# Patient Record
Sex: Female | Born: 1965 | Race: White | Hispanic: No | Marital: Single
Health system: Southern US, Community
[De-identification: ages and names within clinical notes are randomized; demographics above are authoritative.]

## PROBLEM LIST (undated history)

## (undated) DIAGNOSIS — I10 Essential (primary) hypertension: Secondary | ICD-10-CM

---

## 2003-12-15 ENCOUNTER — Emergency Department (HOSPITAL_COMMUNITY): Admission: EM | Admit: 2003-12-15 | Discharge: 2003-12-15 | Payer: Self-pay | Admitting: Emergency Medicine

## 2003-12-15 IMAGING — CR DG ANKLE COMPLETE 3+V*L*
2 series · 2 of 2 positions shown · non-contrast
Comparison: none

CLINICAL DATA: Injury.  
 LEFT ANKLE (THREE VIEWS)

[view not recorded (1 of 2)]
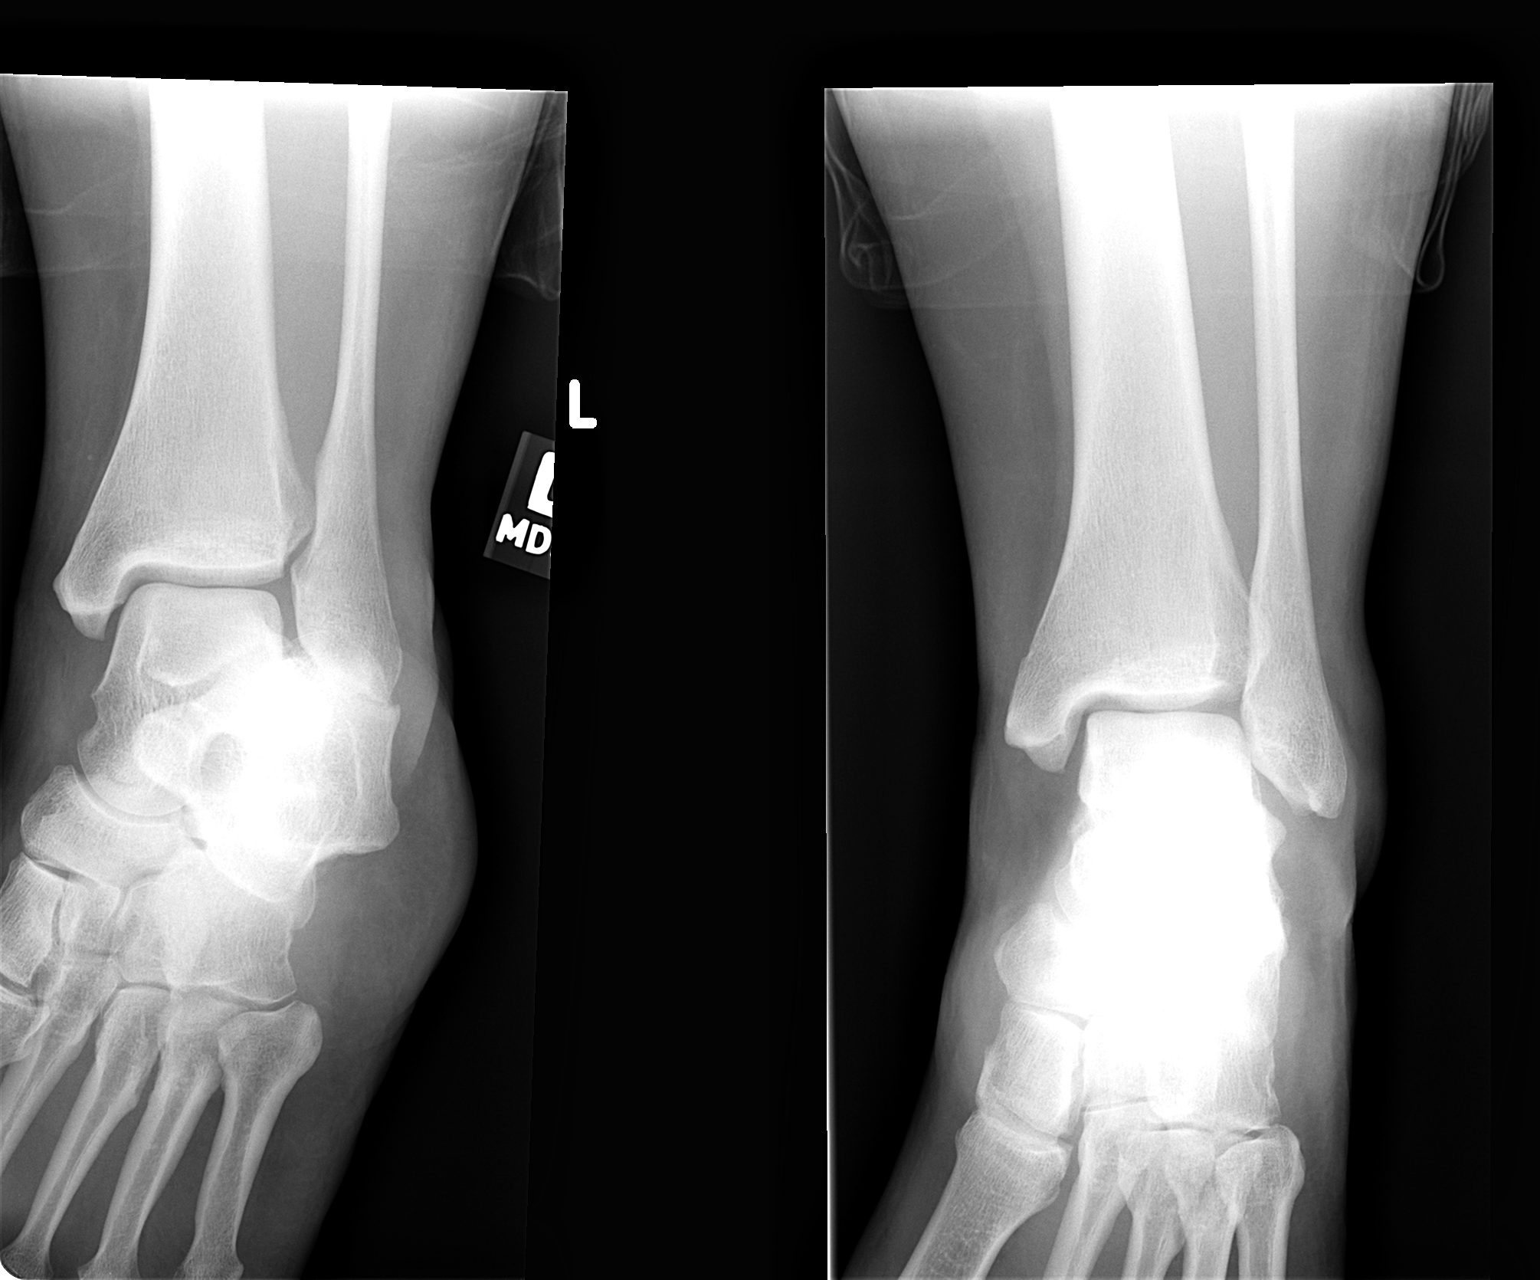

[view not recorded (2 of 2)]
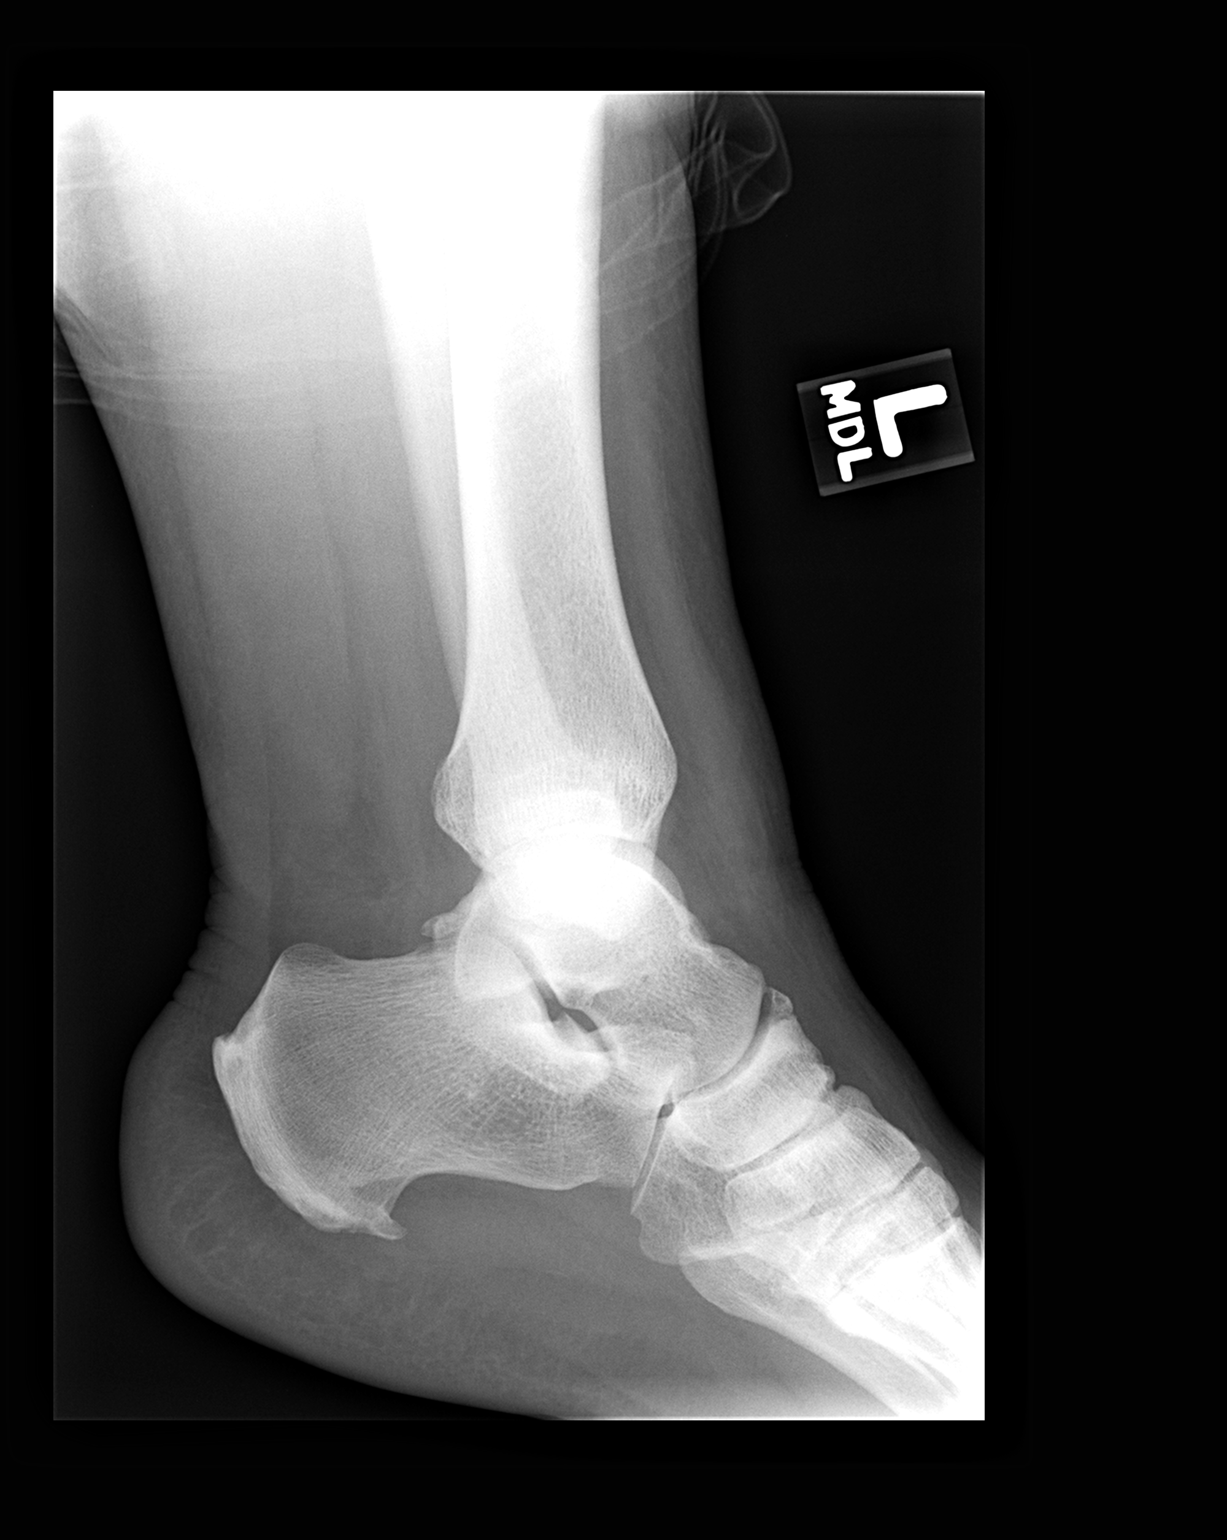

[2 of 2 positions shown; findings below may reference images not displayed]

FINDINGS: No evidence of fracture or dislocation.  If symptoms persists MR BENEK be considered for further delineation.  
 IMPRESSION
 No evidence of fracture.

## 2021-10-16 ENCOUNTER — Emergency Department (HOSPITAL_BASED_OUTPATIENT_CLINIC_OR_DEPARTMENT_OTHER): Payer: BC Managed Care – PPO

## 2021-10-16 ENCOUNTER — Emergency Department (HOSPITAL_BASED_OUTPATIENT_CLINIC_OR_DEPARTMENT_OTHER)
Admission: EM | Admit: 2021-10-16 | Discharge: 2021-10-16 | Disposition: A | Discharge status: Home / Self Care | Payer: BC Managed Care – PPO | Attending: Emergency Medicine | Admitting: Emergency Medicine

## 2021-10-16 ENCOUNTER — Emergency Department (HOSPITAL_COMMUNITY): Payer: BC Managed Care – PPO

## 2021-10-16 ENCOUNTER — Encounter (HOSPITAL_BASED_OUTPATIENT_CLINIC_OR_DEPARTMENT_OTHER): Payer: Self-pay

## 2021-10-16 ENCOUNTER — Other Ambulatory Visit: Payer: Self-pay

## 2021-10-16 DIAGNOSIS — R1110 Vomiting, unspecified: Secondary | ICD-10-CM | POA: Diagnosis not present

## 2021-10-16 DIAGNOSIS — R42 Dizziness and giddiness: Secondary | ICD-10-CM | POA: Diagnosis present

## 2021-10-16 DIAGNOSIS — R197 Diarrhea, unspecified: Secondary | ICD-10-CM | POA: Diagnosis not present

## 2021-10-16 DIAGNOSIS — R5383 Other fatigue: Secondary | ICD-10-CM | POA: Diagnosis not present

## 2021-10-16 DIAGNOSIS — R531 Weakness: Secondary | ICD-10-CM | POA: Diagnosis not present

## 2021-10-16 DIAGNOSIS — I10 Essential (primary) hypertension: Secondary | ICD-10-CM | POA: Insufficient documentation

## 2021-10-16 HISTORY — DX: Essential (primary) hypertension: I10

## 2021-10-16 LAB — COMPREHENSIVE METABOLIC PANEL
ALT: 13 U/L (ref 0–44)
AST: 14 U/L — ABNORMAL LOW (ref 15–41)
Albumin: 3.4 g/dL — ABNORMAL LOW (ref 3.5–5.0)
Alkaline Phosphatase: 106 U/L (ref 38–126)
Anion gap: 7 (ref 5–15)
BUN: 14 mg/dL (ref 6–20)
CO2: 26 mmol/L (ref 22–32)
Calcium: 8.7 mg/dL — ABNORMAL LOW (ref 8.9–10.3)
Chloride: 105 mmol/L (ref 98–111)
Creatinine, Ser: 0.83 mg/dL (ref 0.44–1.00)
GFR, Estimated: >60 mL/min (ref >60)
Glucose, Bld: 115 mg/dL — ABNORMAL HIGH (ref 70–99)
Potassium: 3.8 mmol/L (ref 3.5–5.1)
Sodium: 138 mmol/L (ref 135–145)
Total Bilirubin: 1 mg/dL (ref 0.3–1.2)
Total Protein: 6.9 g/dL (ref 6.5–8.1)

## 2021-10-16 LAB — CBC WITH DIFFERENTIAL/PLATELET
Abs Immature Granulocytes: 0.01 10*3/uL (ref 0.00–0.07)
Basophils Absolute: 0.1 10*3/uL (ref 0.0–0.1)
Basophils Relative: 1 %
Eosinophils Absolute: 0.2 10*3/uL (ref 0.0–0.5)
Eosinophils Relative: 2 %
HCT: 36.3 % (ref 36.0–46.0)
Hemoglobin: 12.1 g/dL (ref 12.0–15.0)
Immature Granulocytes: 0 %
Lymphocytes Relative: 16 %
Lymphs Abs: 1.3 10*3/uL (ref 0.7–4.0)
MCH: 30.1 pg (ref 26.0–34.0)
MCHC: 33.3 g/dL (ref 30.0–36.0)
MCV: 90.3 fL (ref 80.0–100.0)
Monocytes Absolute: 0.5 10*3/uL (ref 0.1–1.0)
Monocytes Relative: 5 %
Neutro Abs: 6.4 10*3/uL (ref 1.7–7.7)
Neutrophils Relative %: 76 %
Platelets: 259 10*3/uL (ref 150–400)
RBC: 4.02 MIL/uL (ref 3.87–5.11)
RDW: 14.2 % (ref 11.5–15.5)
WBC: 8.5 10*3/uL (ref 4.0–10.5)
nRBC: 0 % (ref 0.0–0.2)

## 2021-10-16 LAB — LIPASE, BLOOD: Lipase: 33 U/L (ref 11–51)

## 2021-10-16 IMAGING — MR MR CERVICAL SPINE W/O CM
5 series · 36 of 48 positions shown · non-contrast
Comparison: None.

CLINICAL DATA: Dizziness and fatigue

EXAM:
MRI HEAD WITHOUT CONTRAST
MRI CERVICAL SPINE WITHOUT CONTRAST
TECHNIQUE: Multiplanar, multiecho pulse sequences of the brain and surrounding
structures, and cervical spine, to include the craniocervical
junction and cervicothoracic junction, were obtained without
intravenous contrast.

[Series 5: T2 · sagittal · 3.0mm · 0.69mm/px · 6 of 15 slices shown (1 of 2)]
[im 1/15]
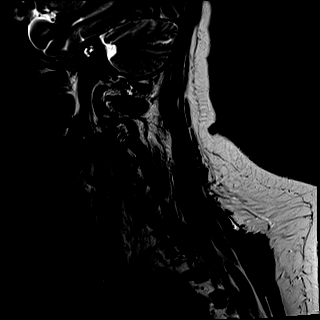
[im 3/15]
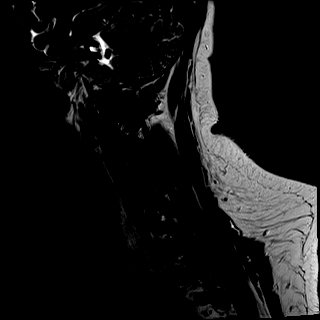
[im 6/15]
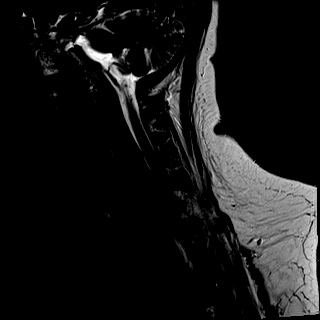
[im 9/15]
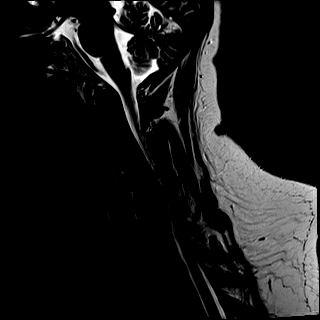
[im 12/15]
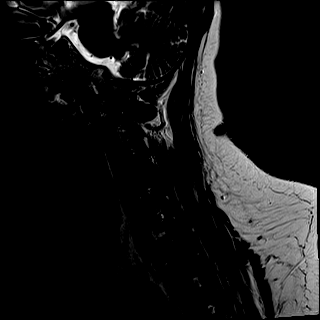
[im 15/15]
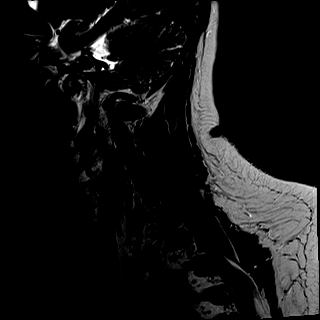

[Series 6: T1 · sagittal · 3.0mm · 0.69mm/px · 6 of 15 slices shown]
[im 1/15]
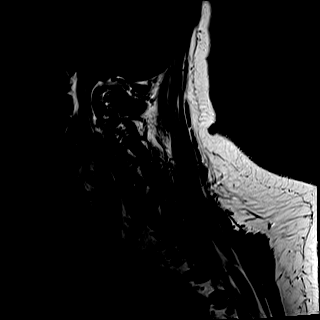
[im 3/15]
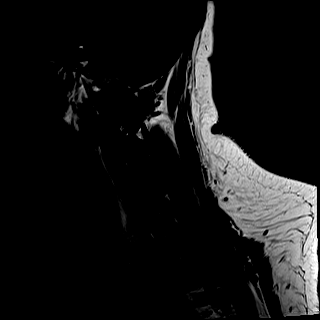
[im 6/15]
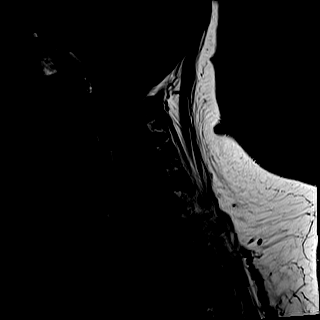
[im 9/15]
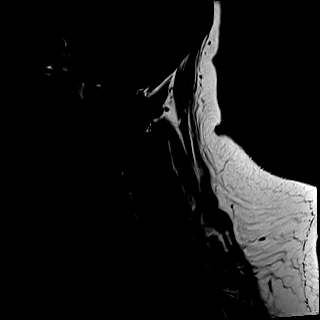
[im 12/15]
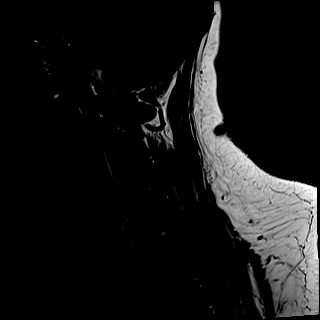
[im 15/15]
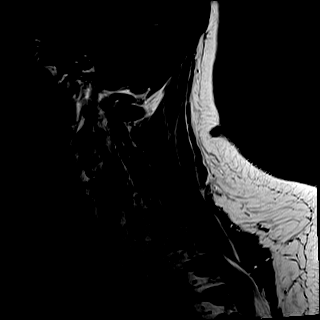

[Series 7: STIR · sagittal · 3.0mm · 0.86mm/px · 6 of 15 slices shown]
[im 1/15]
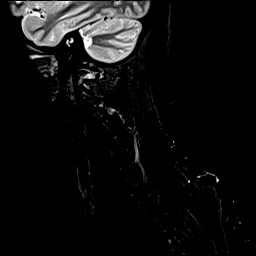
[im 3/15]
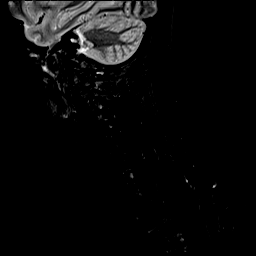
[im 6/15]
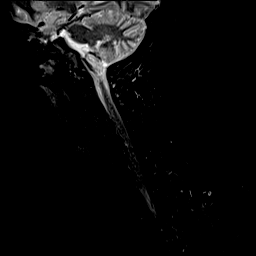
[im 9/15]
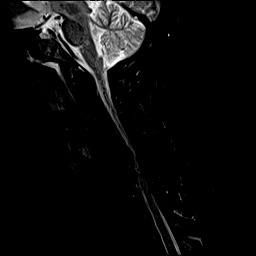
[im 12/15]
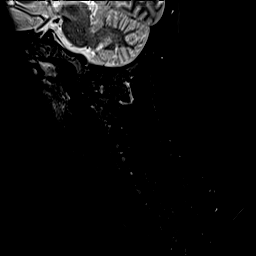
[im 15/15]
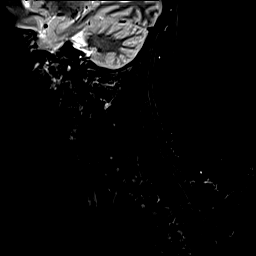

[Series 8: T2 · axial · 3.0mm · 0.66mm/px · z∈[-251,-131]mm · 10 of 40 slices shown (2 of 2)]
[im 1/40]
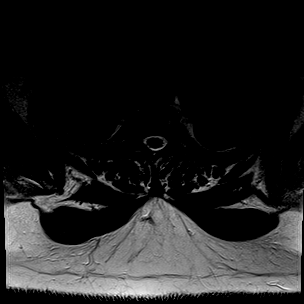
[im 3/40]
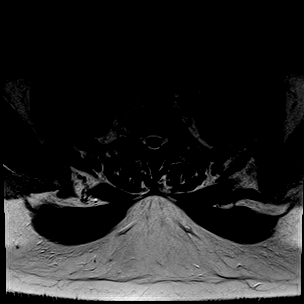
[im 6/40]
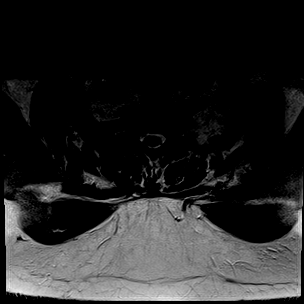
[im 12/40]
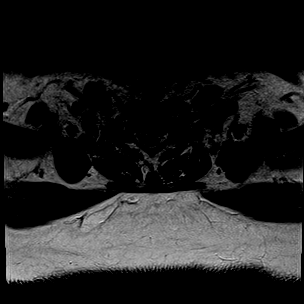
[im 17/40]
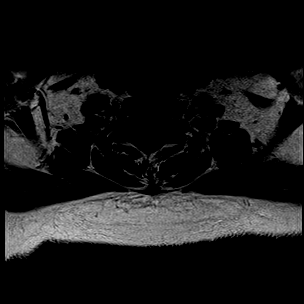
[im 20/40]
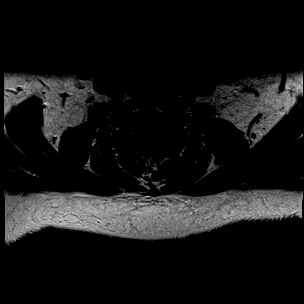
[im 23/40]
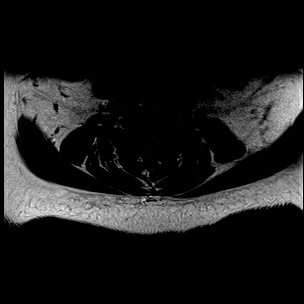
[im 28/40]
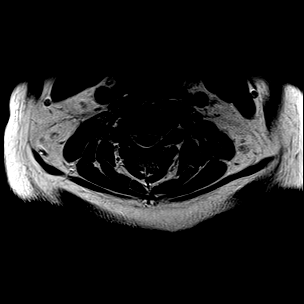
[im 34/40]
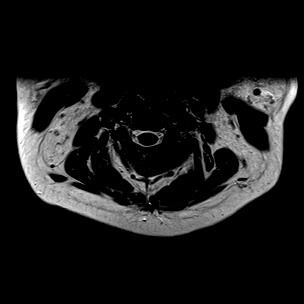
[im 40/40]
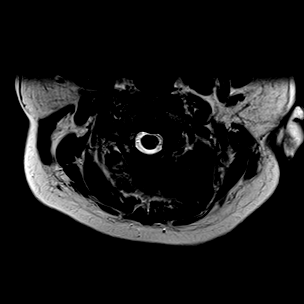

[Series 9: GRE · axial · 3.0mm · 0.39mm/px · z∈[-251,-131]mm · 8 of 40 slices shown]
[im 1/40]
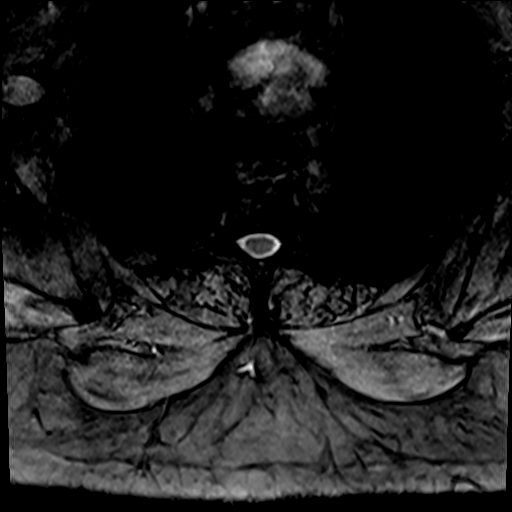
[im 6/40]
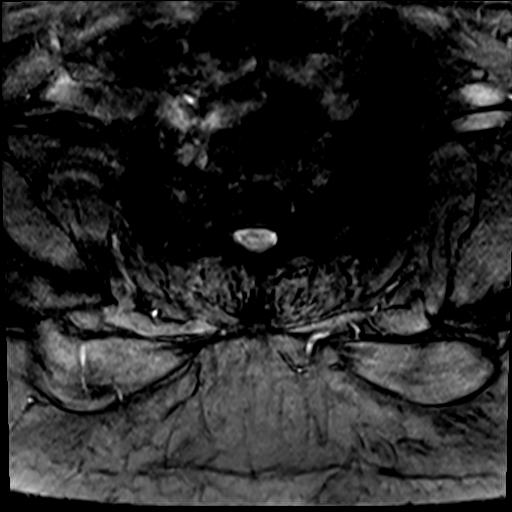
[im 12/40]
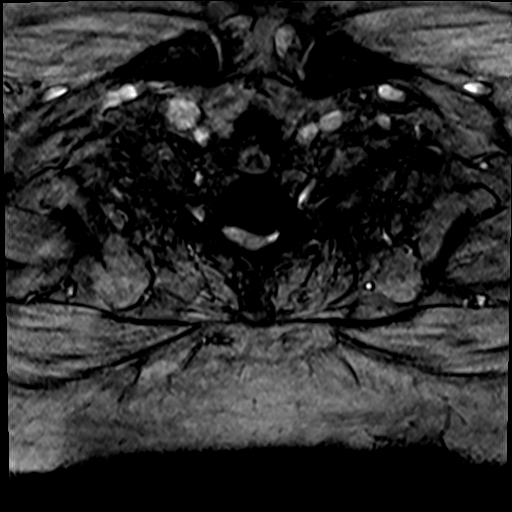
[im 17/40]
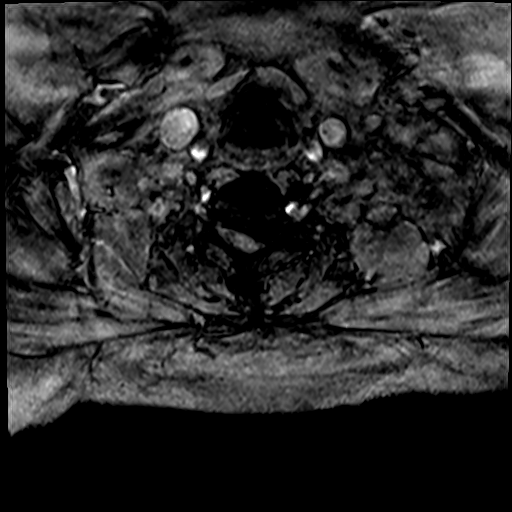
[im 23/40]
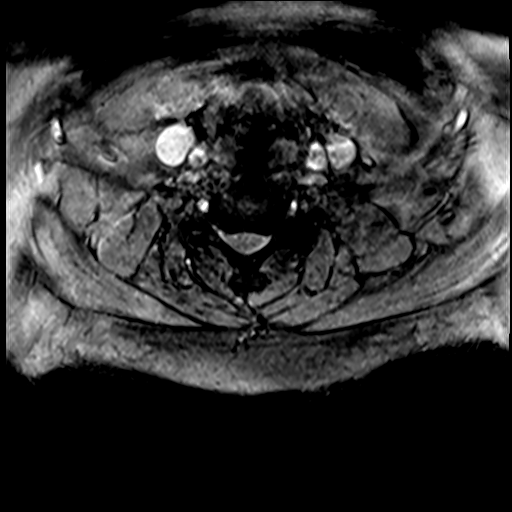
[im 28/40]
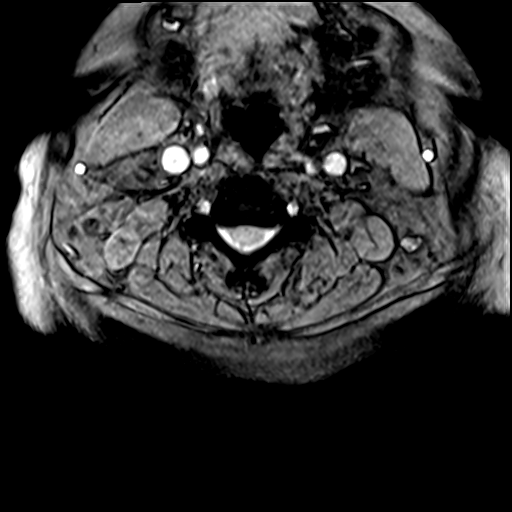
[im 34/40]
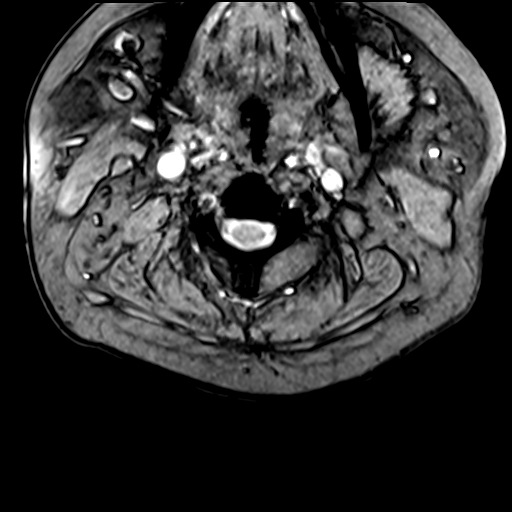
[im 40/40]
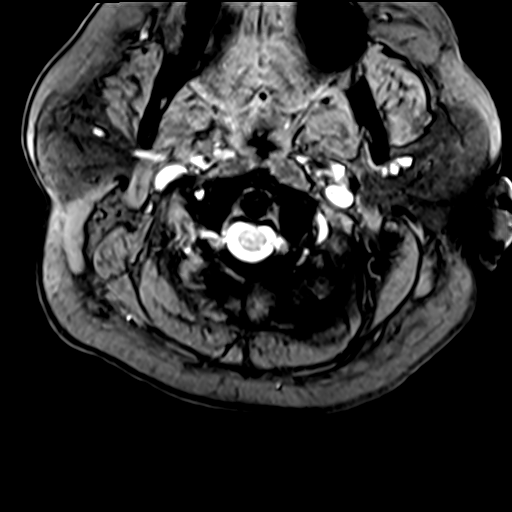

[36 of 48 positions shown; findings below may reference images not displayed]

FINDINGS: MRI HEAD FINDINGS

Brain: No acute infarct, mass effect or extra-axial collection. No
acute or chronic hemorrhage. Normal white matter signal, parenchymal
volume and CSF spaces. The midline structures are normal.

Vascular: Major flow voids are preserved.

Skull and upper cervical spine: Normal calvarium and skull base.
Visualized upper cervical spine and soft tissues are normal.

Sinuses/Orbits:No paranasal sinus fluid levels or advanced mucosal
thickening. No mastoid or middle ear effusion. Normal orbits.

MRI CERVICAL SPINE FINDINGS

Alignment: Physiologic.

Vertebrae: No fracture, evidence of discitis, or bone lesion.

Cord: Normal signal and morphology.

Posterior Fossa, vertebral arteries, paraspinal tissues: Negative.

Disc levels:

C1-2: Unremarkable.

C2-3: Normal disc space and facet joints. There is no spinal canal
stenosis. No neural foraminal stenosis.

C3-4: Bilateral uncovertebral spurring. There is no spinal canal
stenosis. Mild right and moderate left neural foraminal stenosis.

C4-5: Disc bulge and bilateral uncovertebral hypertrophy. There is
no spinal canal stenosis. Moderate right and severe left neural
foraminal stenosis.

C5-6: Large left subarticular disc extrusion with inferior
migration. Severe spinal canal stenosis. Severe left neural
foraminal stenosis.

C6-7: Left asymmetric disc bulge with uncovertebral hypertrophy.
Mild spinal canal stenosis. Severe left neural foraminal stenosis.

C7-T1: Normal disc space and facet joints. There is no spinal canal
stenosis. No neural foraminal stenosis.
IMPRESSION: 1. Normal MRI of the brain.
2. Large left subarticular disc extrusion with inferior migration at
C5-6 with severe spinal canal stenosis and severe left neural
foraminal stenosis.
3. Moderate right and severe left neural foraminal stenosis at C4-5.
4. Mild spinal canal stenosis and severe left neural foraminal
stenosis at C6-7.

## 2021-10-16 IMAGING — CT CT HEAD W/O CM
3 series · 15 of 47 positions shown, 18 images · non-contrast
Comparison: None.

CLINICAL DATA: Syncope/presyncope, cerebrovascular cause suspected.
Dizziness, nausea, and hypertension.



[Series 2: head wo · axial · 0.42mm/px · z∈[-195,-65]mm · 9 of 32 slices shown, 12 images]
[im 3/32  brain]
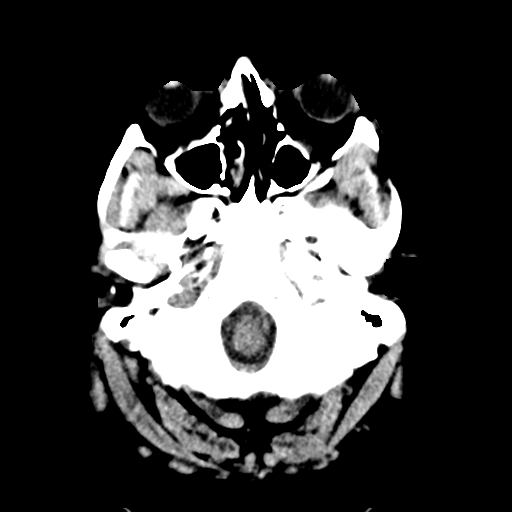
[im 3/32  bone]
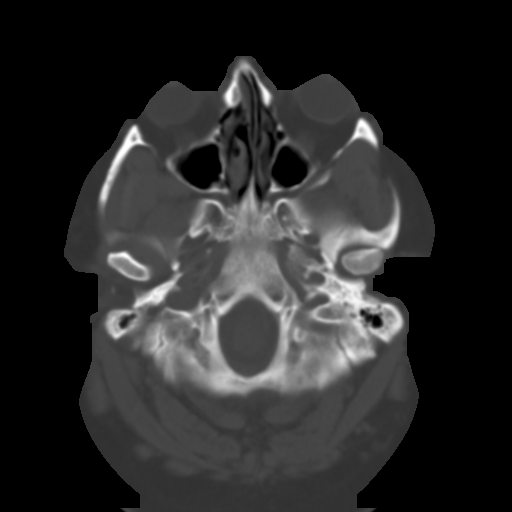
[im 6/32  brain]
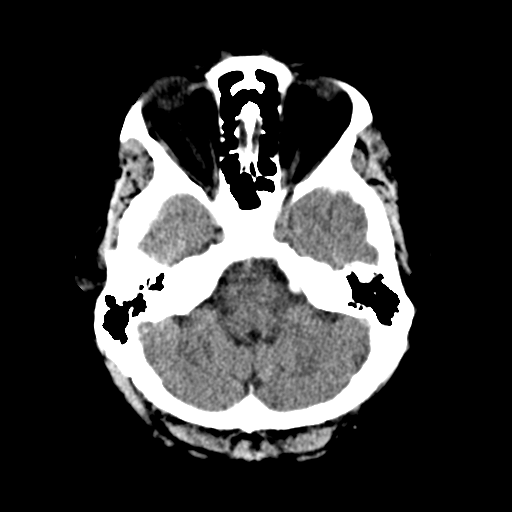
[im 9/32  brain]
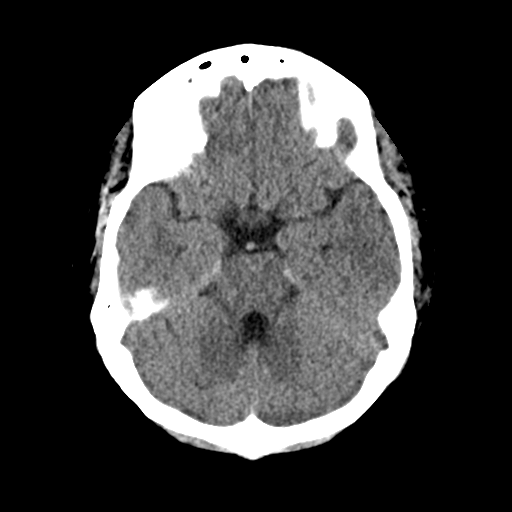
[im 12/32  brain]
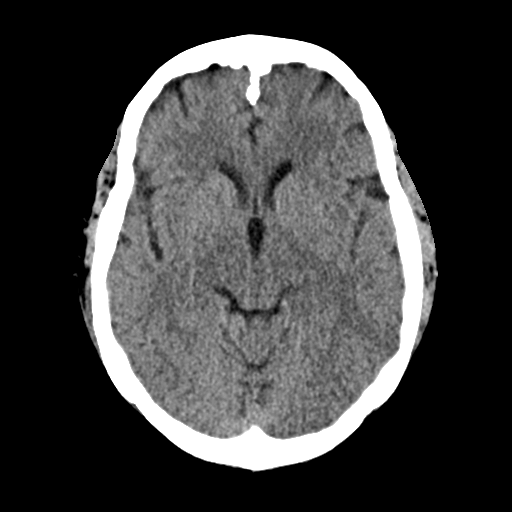
[im 17/32  brain]
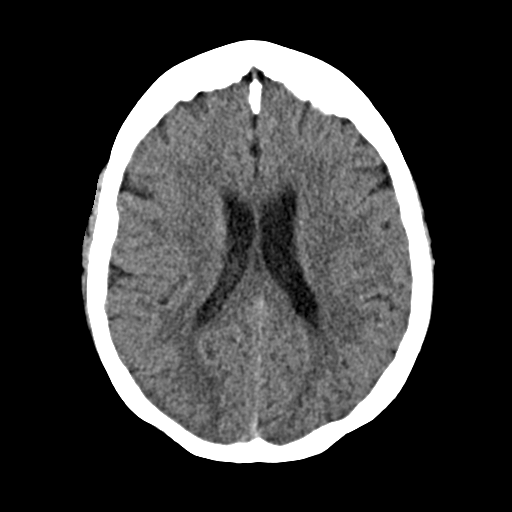
[im 17/32  bone]
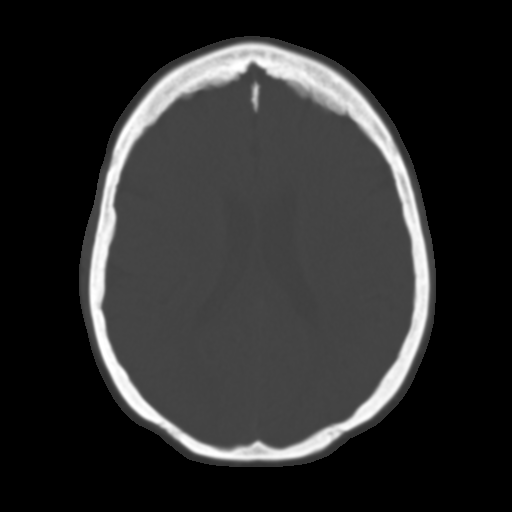
[im 20/32  brain]
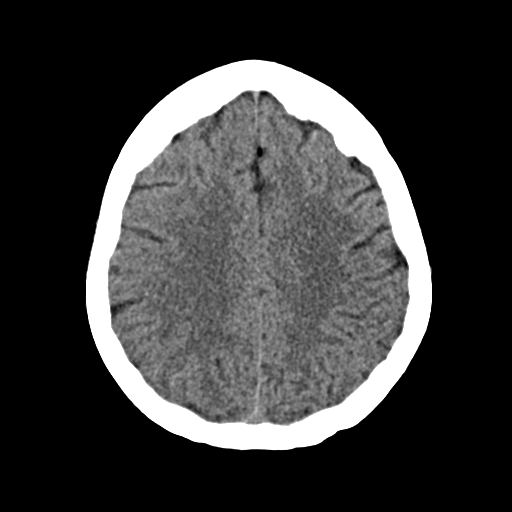
[im 23/32  brain]
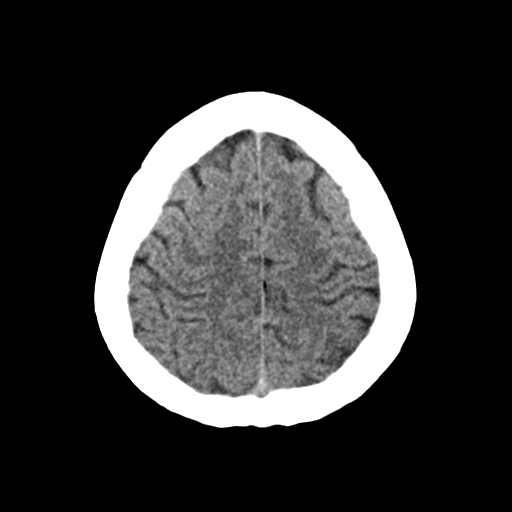
[im 26/32  brain]
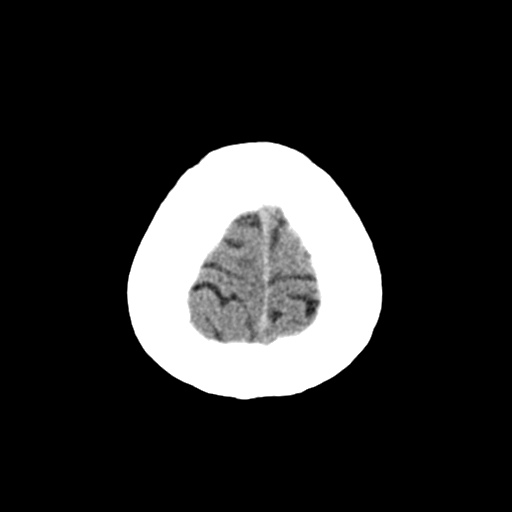
[im 29/32  brain]
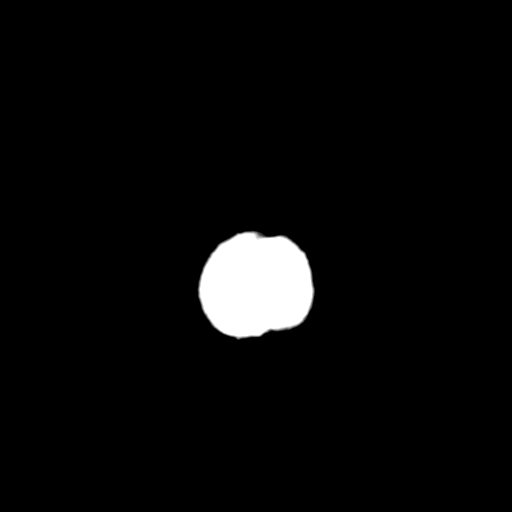
[im 29/32  bone]
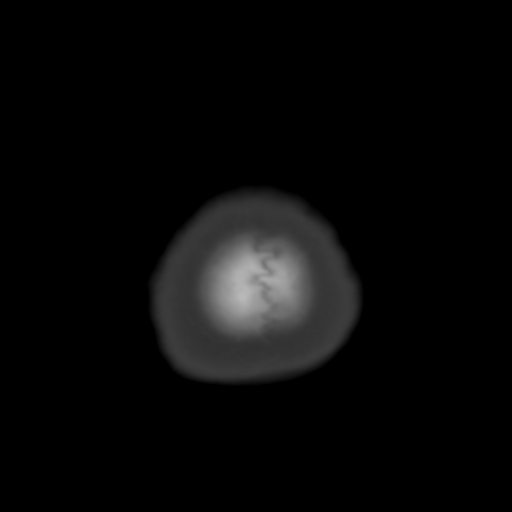

[Series 4: coronal soft · coronal · 0.33mm/px · 3 of 68 slices shown]
[im 23/68  brain]
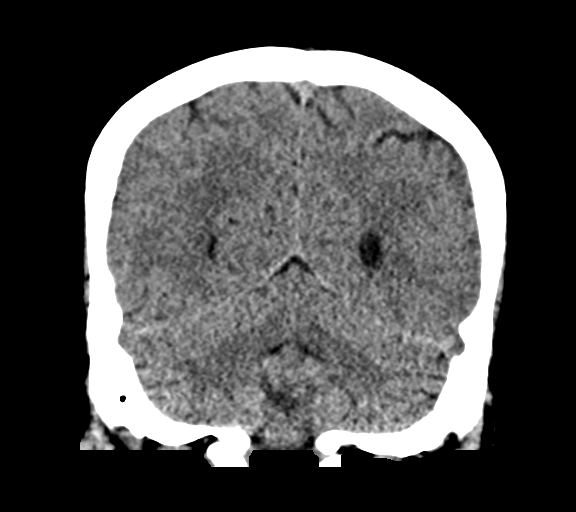
[im 30/68  brain]
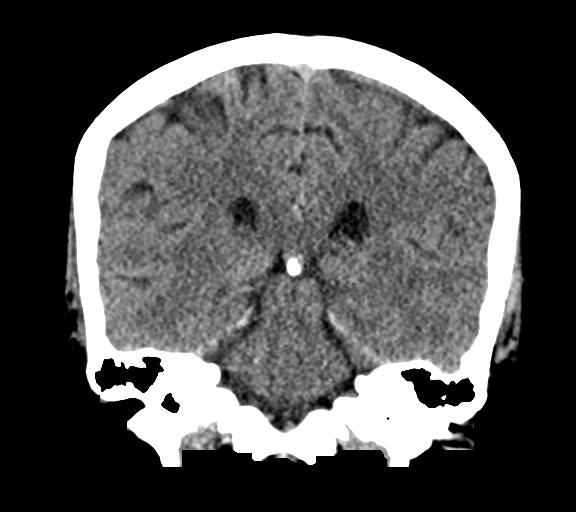
[im 38/68  brain]
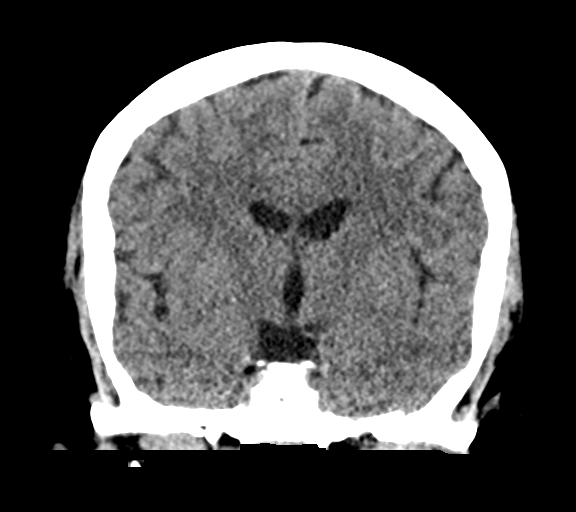

[Series 5: sag soft · sagittal · 0.31mm/px · 3 of 57 slices shown]
[im 19/57  brain]
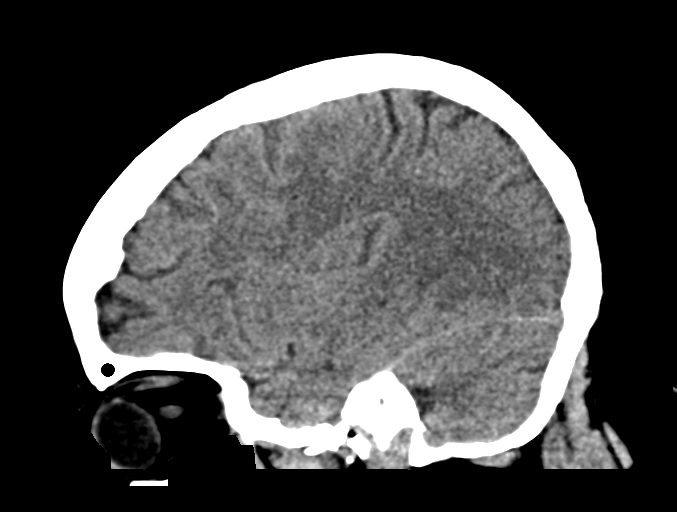
[im 29/57  brain]
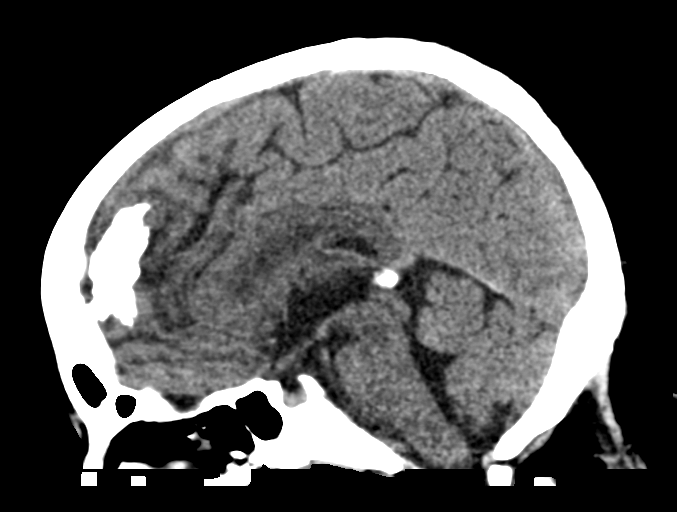
[im 38/57  brain]
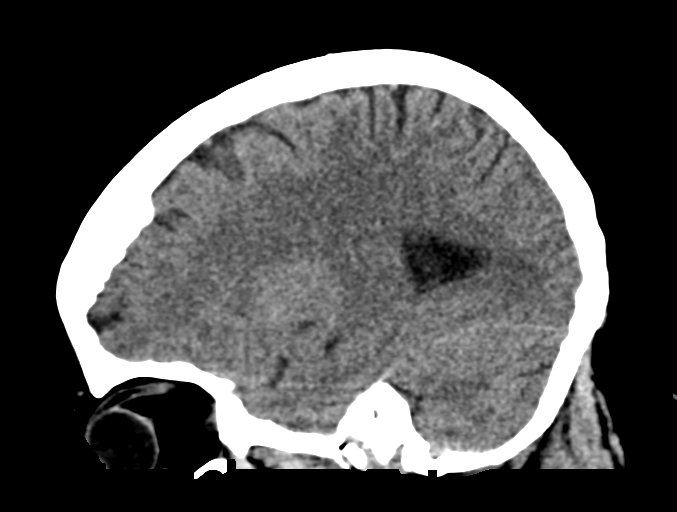

[15 of 47 positions shown; findings below may reference images not displayed]

FINDINGS: Brain: There is no evidence of an acute infarct, intracranial
hemorrhage, mass, midline shift, or extra-axial fluid collection.
The ventricles and sulci are normal.

Vascular: No hyperdense vessel.

Skull: No acute fracture or suspicious osseous lesion.

Sinuses/Orbits: Partially visualized small mucous retention cyst in
the right maxillary sinus. Clear mastoid air cells. Unremarkable
orbits.

Other: None.
IMPRESSION: Unremarkable CT appearance of the brain.

## 2021-10-16 IMAGING — MR MR HEAD W/O CM
12 of 13 series · 44 of 48 positions shown · non-contrast
Comparison: None.

CLINICAL DATA: Dizziness and fatigue

EXAM:
MRI HEAD WITHOUT CONTRAST
MRI CERVICAL SPINE WITHOUT CONTRAST
TECHNIQUE: Multiplanar, multiecho pulse sequences of the brain and surrounding
structures, and cervical spine, to include the craniocervical
junction and cervicothoracic junction, were obtained without
intravenous contrast.

[Series 5: DWI · axial · 3.0mm · 0.88mm/px · z∈[-116,+27]mm · 8 of 100 slices shown (1 of 4)]
[im 1/100]
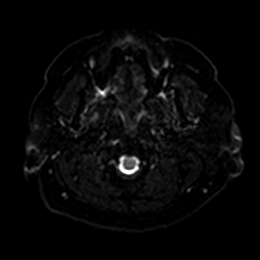
[im 15/100]
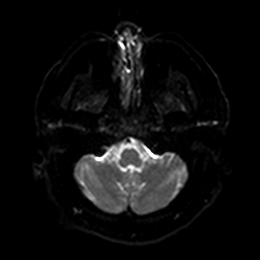
[im 29/100]
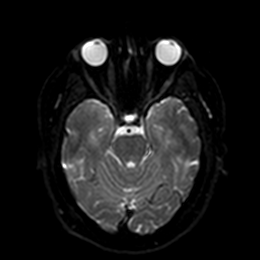
[im 43/100]
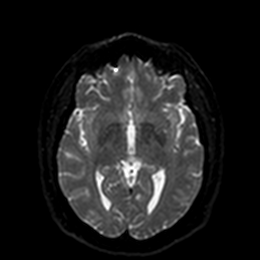
[im 57/100]
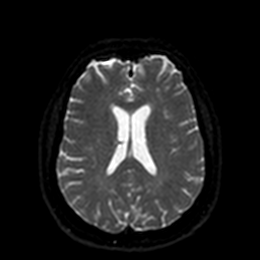
[im 71/100]
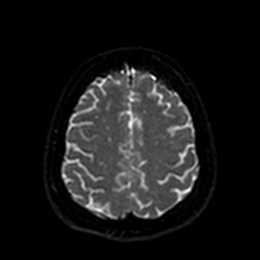
[im 85/100]
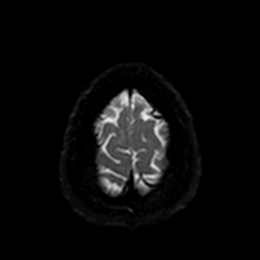
[im 100/100]
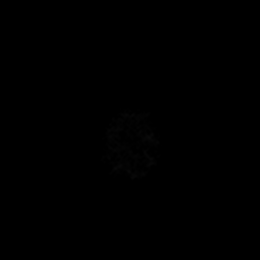

[Series 6: DWI · axial · 3.0mm · 0.88mm/px · z∈[-116,+27]mm · 4 of 50 slices shown (2 of 4)]
[im 1/50]
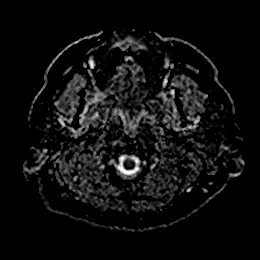
[im 17/50]
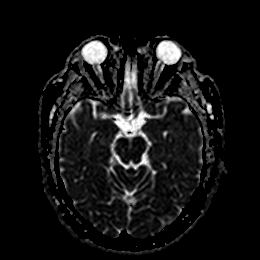
[im 33/50]
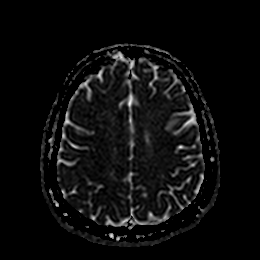
[im 50/50]
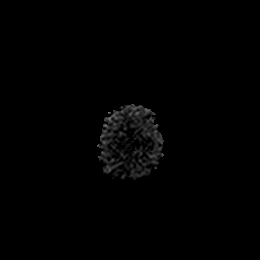

[Series 7: DWI · coronal · 4.0mm · 0.88mm/px · 5 of 68 slices shown (3 of 4)]
[im 1/68]
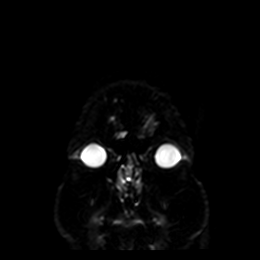
[im 17/68]
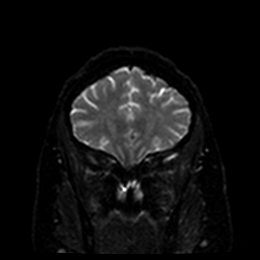
[im 34/68]
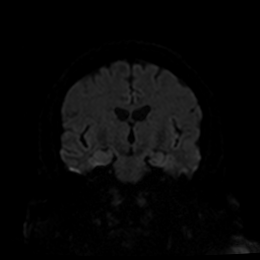
[im 51/68]
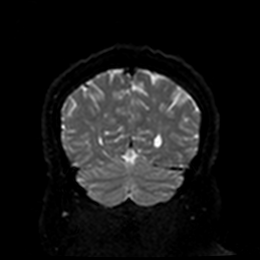
[im 68/68]
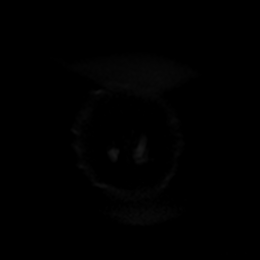

[Series 8: DWI · coronal · 4.0mm · 0.88mm/px · 3 of 34 slices shown (4 of 4)]
[im 1/34]
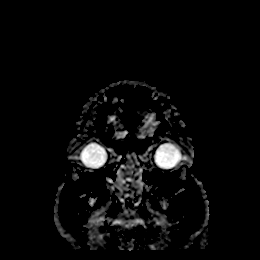
[im 17/34]
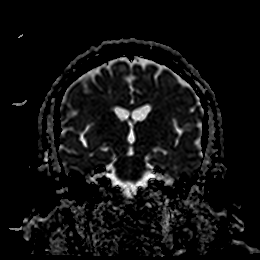
[im 34/34]
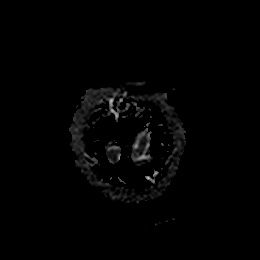

[Series 9: T1 · sagittal · 5.0mm · 0.75mm/px · 2 of 23 slices shown]
[im 1/23]
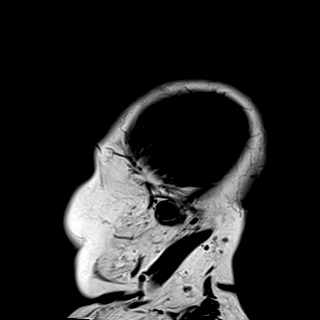
[im 23/23]
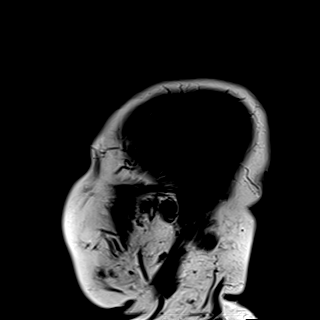

[Series 10: T2 · axial · 5.0mm · 0.72mm/px · z∈[-120,+26]mm · 2 of 26 slices shown (1 of 2)]
[im 1/26]
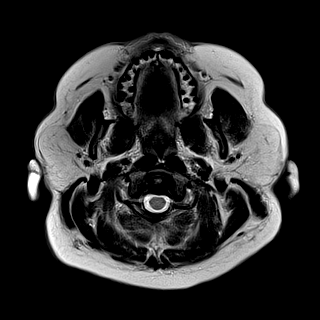
[im 26/26]
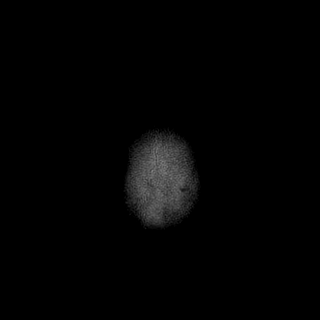

[Series 11: FLAIR · axial · 5.0mm · 0.45mm/px · z∈[-121,+25]mm · 2 of 26 slices shown]
[im 1/26]
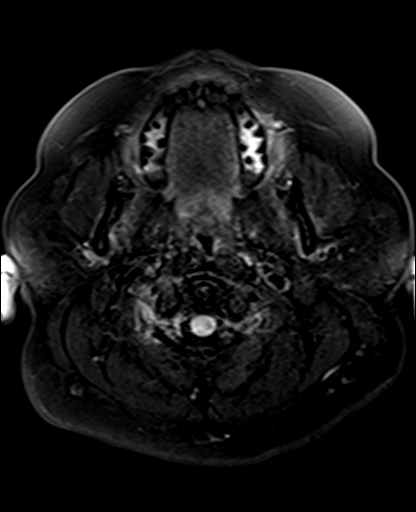
[im 26/26]
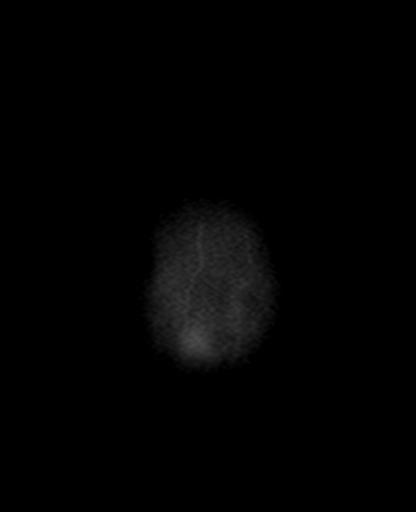

[Series 12: mag_images · axial · 3.0mm · 0.90mm/px · z∈[-128,+32]mm · 4 of 56 slices shown]
[im 1/56]
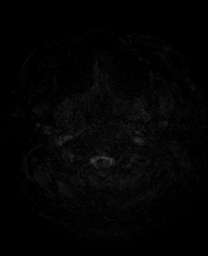
[im 19/56]
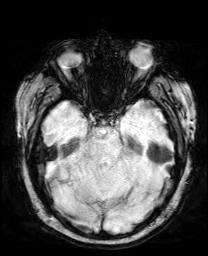
[im 37/56]
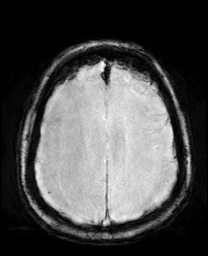
[im 56/56]
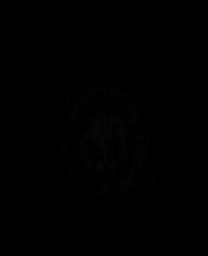

[Series 13: pha_images · axial · 3.0mm · 0.90mm/px · z∈[-128,+32]mm · 4 of 56 slices shown]
[im 1/56]
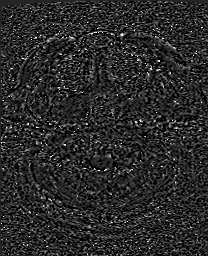
[im 19/56]
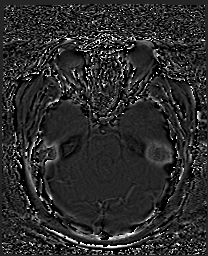
[im 37/56]
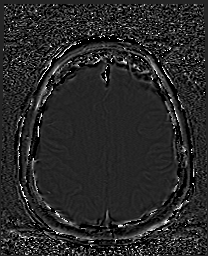
[im 56/56]
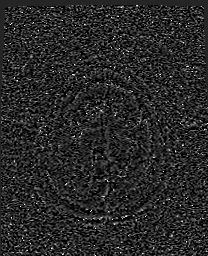

[Series 14: swi_images · axial · 3.0mm · 0.90mm/px · z∈[-128,+32]mm · 4 of 56 slices shown]
[im 1/56]
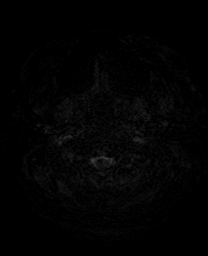
[im 19/56]
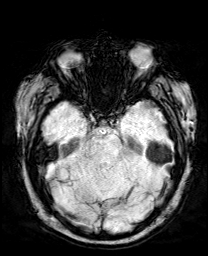
[im 37/56]
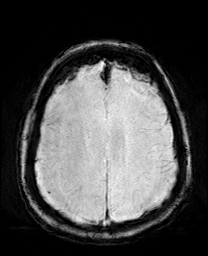
[im 56/56]
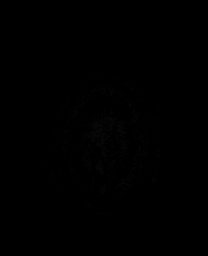

[Series 15: mip_images(sw) · axial · 24.0mm · 0.90mm/px · z∈[-118,+22]mm · 4 of 49 slices shown]
[im 1/49]
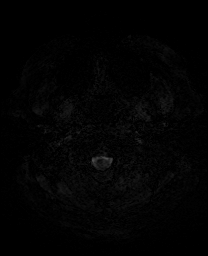
[im 17/49]
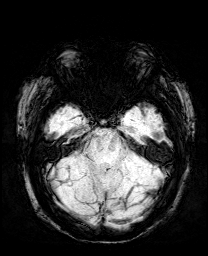
[im 33/49]
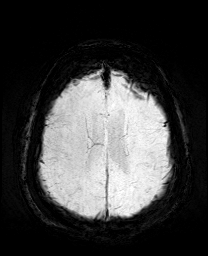
[im 49/49]
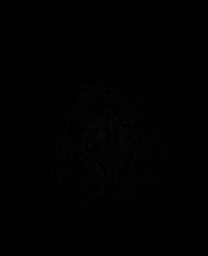

[Series 17: T2 · coronal · 5.0mm · 0.34mm/px · 2 of 29 slices shown (2 of 2)]
[im 1/29]
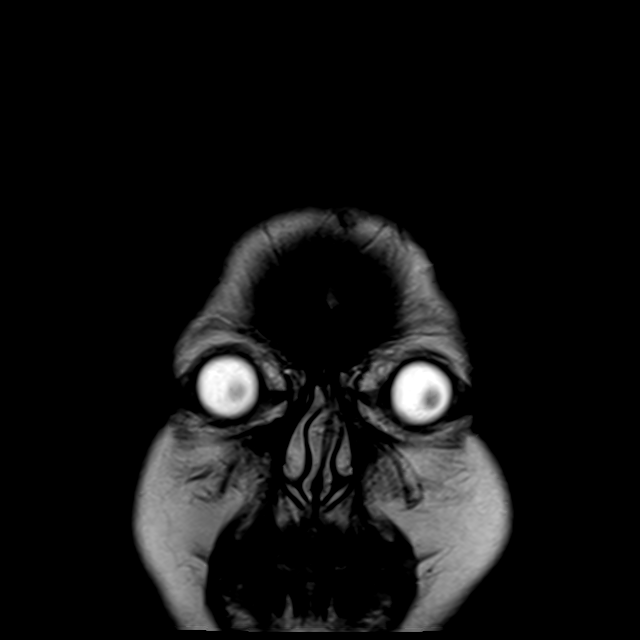
[im 29/29]
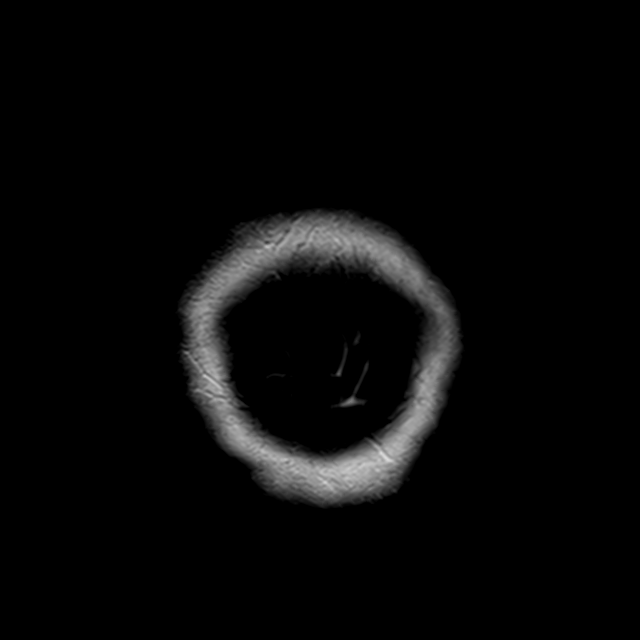

[44 of 48 positions shown; findings below may reference images not displayed]

FINDINGS: MRI HEAD FINDINGS

Brain: No acute infarct, mass effect or extra-axial collection. No
acute or chronic hemorrhage. Normal white matter signal, parenchymal
volume and CSF spaces. The midline structures are normal.

Vascular: Major flow voids are preserved.

Skull and upper cervical spine: Normal calvarium and skull base.
Visualized upper cervical spine and soft tissues are normal.

Sinuses/Orbits:No paranasal sinus fluid levels or advanced mucosal
thickening. No mastoid or middle ear effusion. Normal orbits.

MRI CERVICAL SPINE FINDINGS

Alignment: Physiologic.

Vertebrae: No fracture, evidence of discitis, or bone lesion.

Cord: Normal signal and morphology.

Posterior Fossa, vertebral arteries, paraspinal tissues: Negative.

Disc levels:

C1-2: Unremarkable.

C2-3: Normal disc space and facet joints. There is no spinal canal
stenosis. No neural foraminal stenosis.

C3-4: Bilateral uncovertebral spurring. There is no spinal canal
stenosis. Mild right and moderate left neural foraminal stenosis.

C4-5: Disc bulge and bilateral uncovertebral hypertrophy. There is
no spinal canal stenosis. Moderate right and severe left neural
foraminal stenosis.

C5-6: Large left subarticular disc extrusion with inferior
migration. Severe spinal canal stenosis. Severe left neural
foraminal stenosis.

C6-7: Left asymmetric disc bulge with uncovertebral hypertrophy.
Mild spinal canal stenosis. Severe left neural foraminal stenosis.

C7-T1: Normal disc space and facet joints. There is no spinal canal
stenosis. No neural foraminal stenosis.
IMPRESSION: 1. Normal MRI of the brain.
2. Large left subarticular disc extrusion with inferior migration at
C5-6 with severe spinal canal stenosis and severe left neural
foraminal stenosis.
3. Moderate right and severe left neural foraminal stenosis at C4-5.
4. Mild spinal canal stenosis and severe left neural foraminal
stenosis at C6-7.

## 2021-10-16 IMAGING — CT CT ANGIO HEAD-NECK (W OR W/O PERF)
1 of 8 series · 6 of 33 positions shown · IV contrast (Omnipaque)
Comparison: Same-day CT head

CLINICAL DATA: Stroke suspected, nausea, vomiting, fatigue,
dizziness

EXAM:
CT ANGIOGRAPHY HEAD AND NECK
TECHNIQUE: Multidetector CT imaging of the head and neck was performed using
the standard protocol during bolus administration of intravenous
contrast. Multiplanar CT image reconstructions and MIPs were
obtained to evaluate the vascular anatomy. Carotid stenosis
measurements (when applicable) are obtained utilizing NASCET
criteria, using the distal internal carotid diameter as the
denominator.

[Series 7: axial thin · axial · 0.51mm/px · z∈[-311,-67]mm · 6 of 342 slices shown]
[im 49/342  soft-tissue]
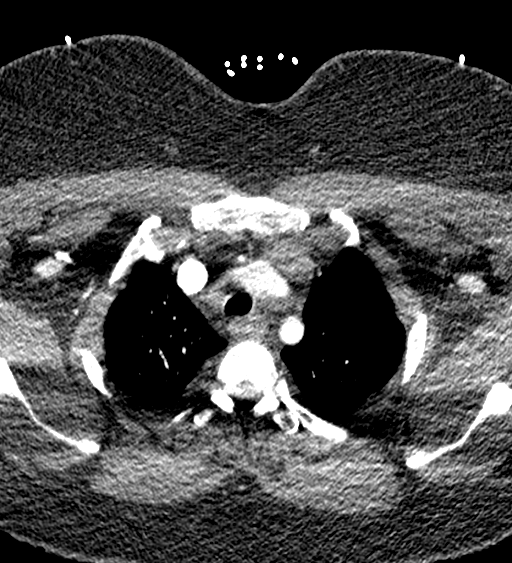
[im 98/342  bone]
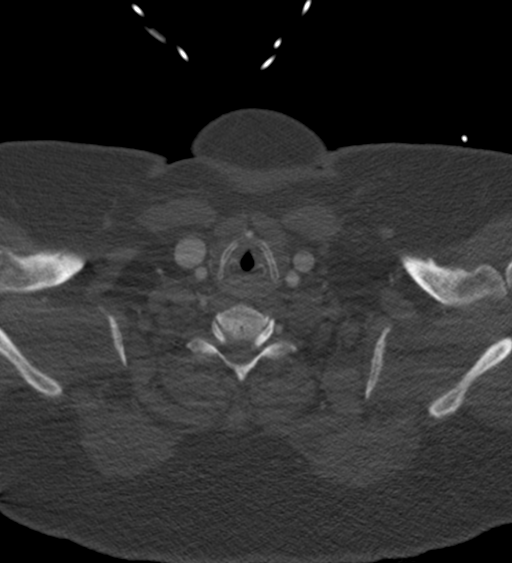
[im 147/342  soft-tissue]
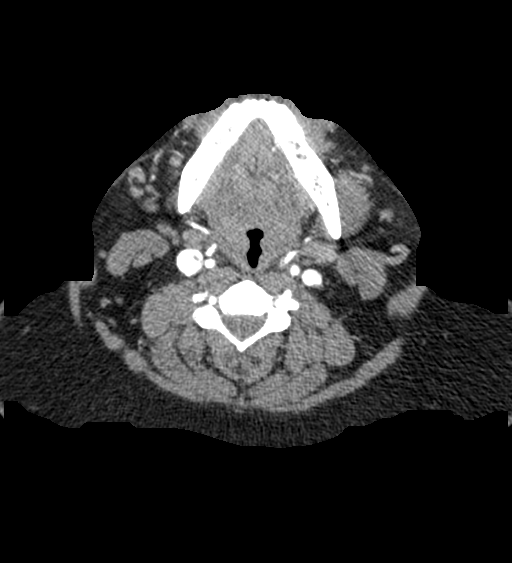
[im 195/342  bone]
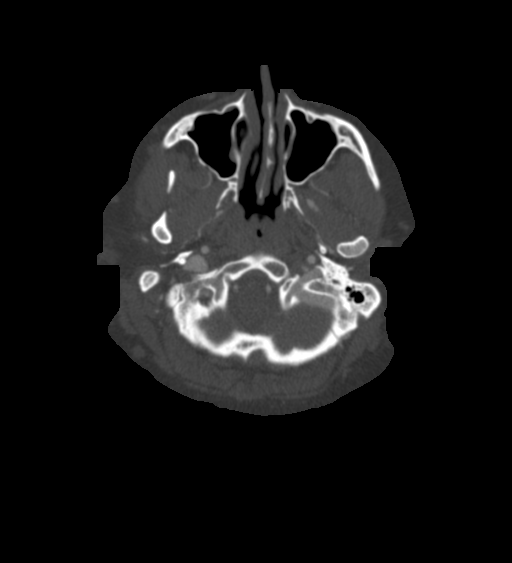
[im 244/342  soft-tissue]
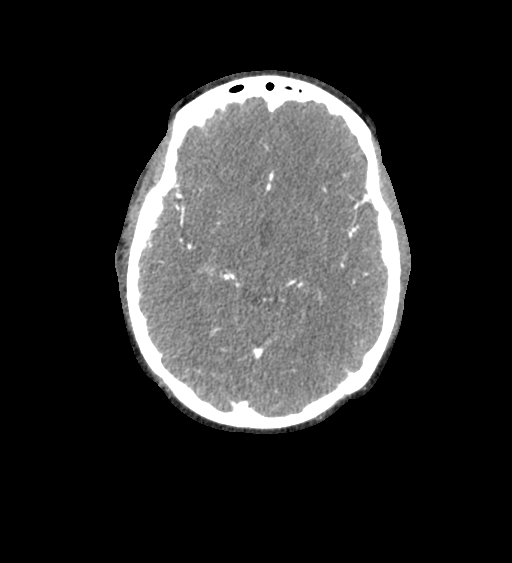
[im 293/342  bone]
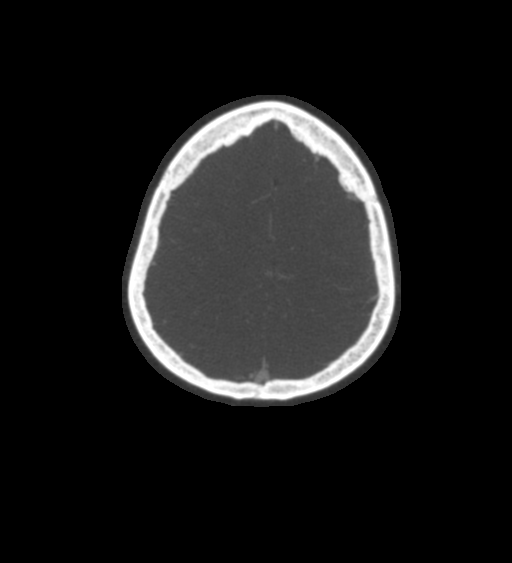

[6 of 33 positions shown; findings below may reference images not displayed]

RADIATION DOSE REDUCTION: This exam was performed according to the
departmental dose-optimization program which includes automated
exposure control, adjustment of the mA and/or kV according to
patient size and/or use of iterative reconstruction technique.

CONTRAST:  100mL OMNIPAQUE IOHEXOL 350 MG/ML SOLN
FINDINGS: CTA NECK FINDINGS

Aortic arch: The imaged aortic arch is normal. The origins of the
major branch vessels are patent. The subclavian arteries are patent
to the level imaged. There is a common origin of the brachiocephalic
and left common carotid arteries, a normal variant.

Right carotid system: The right common, internal, and external
carotid arteries are patent, without hemodynamically significant
stenosis or occlusion. There is no dissection or aneurysm.

Left carotid system: The left common, internal, and external carotid
arteries are patent, without hemodynamically significant stenosis or
occlusion. There is no dissection or aneurysm.

Vertebral arteries: The vertebral arteries are patent, without
hemodynamically significant stenosis or occlusion. There is no
dissection or aneurysm.

Skeleton: There is degenerative change of the cervical spine at
C5-C6 with bulky ossification of the left aspect of the posterior
longitudinal ligament resulting in at least mild spinal canal
stenosis and likely impingement of the left C6 nerve root. There is
no acute osseous abnormality or suspicious osseous lesion. There is
no visible canal hematoma.

Other neck: The soft tissues are unremarkable.

Upper chest: The imaged lung apices are clear.

Review of the MIP images confirms the above findings

CTA HEAD FINDINGS

Anterior circulation: The intracranial ICAs are patent.

The bilateral MCAs are patent. The bilateral ACAs are patent. The
anterior communicating artery is normal.

There is no aneurysm or AVM.

Posterior circulation: The bilateral V4 segments are patent. The
basilar artery is patent.

The bilateral PCAs are patent. The posterior communicating arteries
are not definitely seen.

There is no aneurysm or AVM.

Venous sinuses: Patent.

Anatomic variants: None.

Review of the MIP images confirms the above findings
IMPRESSION: 1. Normal vasculature of the head and neck.
2. Ossification of the posterior longitudinal ligament at C5-C6
resulting in at least mild spinal canal stenosis and possible
impingement of the exiting left C6 nerve root.

## 2021-10-16 MED ORDER — SODIUM CHLORIDE 0.9 % IV BOLUS
1000.0000 mL | Freq: Once | INTRAVENOUS | Status: AC
  Administered 2021-10-16: 1000 mL via INTRAVENOUS

## 2021-10-16 MED ORDER — DIAZEPAM 5 MG/ML IJ SOLN: 2.5000 mg | Freq: Once | INTRAMUSCULAR | Status: DC

## 2021-10-16 MED ORDER — IOHEXOL 350 MG/ML SOLN
100.0000 mL | Freq: Once | INTRAVENOUS | Status: AC | PRN
  Administered 2021-10-16: 100 mL via INTRAVENOUS

## 2021-10-16 MED ORDER — TETRACAINE HCL 0.5 % OP SOLN
1.0000 [drp] | Freq: Once | OPHTHALMIC | Status: AC
  Administered 2021-10-16: 1 [drp] via OPHTHALMIC
  Filled 2021-10-16: qty 4

## 2021-10-16 MED ORDER — MECLIZINE HCL 25 MG PO TABS: 25.0000 mg | ORAL_TABLET | Freq: Three times a day (TID) | ORAL | 0 refills | Status: AC | PRN

## 2021-10-16 MED ORDER — ONDANSETRON HCL 4 MG/2ML IJ SOLN
4.0000 mg | Freq: Once | INTRAMUSCULAR | Status: AC
  Administered 2021-10-16: 4 mg via INTRAVENOUS
  Filled 2021-10-16: qty 2

## 2021-10-16 NOTE — ED Provider Notes (Addendum)
[facility] EMERGENCY DEPARTMENT Provider Note   CSN: [id number] Arrival date & time: 10/16/21  1013     History  Chief Complaint  Patient presents with   Emesis    weakness    Megan Ellis is a 56 y.o. female.  Patient with history of high blood pressure.  States that she has some fatigue this morning and some dizziness.  Just started to gradually get worse as the day went on.  She ate [employer] for breakfast but no other sick contacts.  She vomited twice in route with EMS.  She had an episode of diarrhea this morning.  She denies any abdominal pain.  She denies headache or vision changes.  But does feel lightheaded/dizzy/weak.  She does not have any numbness or weakness in her extremities.  No trouble with her speech or vision.  The history is provided by the patient.  Emesis Severity:  Moderate Duration:  3 hours Timing:  Intermittent Progression:  Unchanged Chronicity:  New Relieved by:  Nothing Worsened by:  Nothing Associated symptoms: diarrhea   Associated symptoms: no abdominal pain, no arthralgias, no chills, no cough, no fever, no headaches, no myalgias, no sore throat and no URI   Risk factors: no sick contacts       Home Medications Prior to Admission medications   Not on File      Allergies    Naproxen    Review of Systems   Review of Systems  Constitutional:  Negative for chills and fever.  HENT:  Negative for sore throat.   Respiratory:  Negative for cough.   Gastrointestinal:  Positive for diarrhea and vomiting. Negative for abdominal pain.  Musculoskeletal:  Negative for arthralgias and myalgias.  Neurological:  Negative for headaches.   Physical Exam Updated Vital Signs BP (!) 145/71   Pulse (!) 56   Temp 98.5 F (36.9 C) (Oral)   Resp 17   SpO2 99%  Physical Exam Vitals and nursing note reviewed.  Constitutional:      General: She is not in acute distress.    Appearance: She is well-developed.  HENT:     Head:  Normocephalic and atraumatic.     Nose: Nose normal.     Mouth/Throat:     Mouth: Mucous membranes are moist.  Eyes:     Extraocular Movements: Extraocular movements intact.     Conjunctiva/sclera: Conjunctivae normal.     Comments: Left pupil is slightly more dilated than the right pupil, both pupils are reactive, extraocular movements are intact  Cardiovascular:     Rate and Rhythm: Normal rate and regular rhythm.     Pulses: Normal pulses.     Heart sounds: Normal heart sounds. No murmur heard. Pulmonary:     Effort: Pulmonary effort is normal. No respiratory distress.     Breath sounds: Normal breath sounds.  Abdominal:     Palpations: Abdomen is soft.     Tenderness: There is no abdominal tenderness.  Musculoskeletal:        General: No swelling.     Cervical back: Neck supple.  Skin:    General: Skin is warm and dry.     Capillary Refill: Capillary refill takes less than 2 seconds.  Neurological:     General: No focal deficit present.     Mental Status: She is alert and oriented to person, place, and time.     Cranial Nerves: No cranial nerve deficit.     Sensory: No sensory deficit.  Motor: No weakness.     Coordination: Coordination normal.     Comments: 5+ out of 5 strength throughout, normal sensation, no drift, normal finger-nose-finger, normal speech  Psychiatric:        Mood and Affect: Mood normal.    ED Results / Procedures / Treatments   Labs (all labs ordered are listed, but only abnormal results are displayed) Labs Reviewed  COMPREHENSIVE METABOLIC PANEL - Abnormal; Notable for the following components:      Result Value   Glucose, Bld 115 (*)    Calcium 8.7 (*)    Albumin 3.4 (*)    AST 14 (*)    All other components within normal limits  CBC WITH DIFFERENTIAL/PLATELET  LIPASE, BLOOD  URINALYSIS, ROUTINE W REFLEX MICROSCOPIC    EKG EKG Interpretation  Date/Time:  Tuesday October 16 2021 10:20:31 EDT Ventricular Rate:  60 PR  Interval:  178 QRS Duration: 87 QT Interval:  436 QTC Calculation: 436 R Axis:   37 Text Interpretation: Sinus rhythm Low voltage, precordial leads Confirmed by Virgina Norfolk ([id number]) on 10/16/2021 10:41:55 AM  Radiology CT ANGIO HEAD NECK W WO CM  Result Date: 10/16/2021 CLINICAL DATA:  Stroke suspected, nausea, vomiting, fatigue, dizziness EXAM: CT ANGIOGRAPHY HEAD AND NECK TECHNIQUE: Multidetector CT imaging of the head and neck was performed using the standard protocol during bolus administration of intravenous contrast. Multiplanar CT image reconstructions and MIPs were obtained to evaluate the vascular anatomy. Carotid stenosis measurements (when applicable) are obtained utilizing NASCET criteria, using the distal internal carotid diameter as the denominator. RADIATION DOSE REDUCTION: This exam was performed according to the departmental dose-optimization program which includes automated exposure control, adjustment of the mA and/or kV according to patient size and/or use of iterative reconstruction technique. CONTRAST:  OMNIPAQUE IOHEXOL 350 MG/ML SOLN COMPARISON:  Same-day CT head FINDINGS: CTA NECK FINDINGS Aortic arch: The imaged aortic arch is normal. The origins of the major branch vessels are patent. The subclavian arteries are patent to the level imaged. There is a common origin of the brachiocephalic and left common carotid arteries, a normal variant. Right carotid system: The right common, internal, and external carotid arteries are patent, without hemodynamically significant stenosis or occlusion. There is no dissection or aneurysm. Left carotid system: The left common, internal, and external carotid arteries are patent, without hemodynamically significant stenosis or occlusion. There is no dissection or aneurysm. Vertebral arteries: The vertebral arteries are patent, without hemodynamically significant stenosis or occlusion. There is no dissection or aneurysm. Skeleton: There is  degenerative change of the cervical spine at C5-C6 with bulky ossification of the left aspect of the posterior longitudinal ligament resulting in at least mild spinal canal stenosis and likely impingement of the left C6 nerve root. There is no acute osseous abnormality or suspicious osseous lesion. There is no visible canal hematoma. Other neck: The soft tissues are unremarkable. Upper chest: The imaged lung apices are clear. Review of the MIP images confirms the above findings CTA HEAD FINDINGS Anterior circulation: The intracranial ICAs are patent. The bilateral MCAs are patent. The bilateral ACAs are patent. The anterior communicating artery is normal. There is no aneurysm or AVM. Posterior circulation: The bilateral V4 segments are patent. The basilar artery is patent. The bilateral PCAs are patent. The posterior communicating arteries are not definitely seen. There is no aneurysm or AVM. Venous sinuses: Patent. Anatomic variants: None. Review of the MIP images confirms the above findings IMPRESSION: 1. Normal vasculature of the head and neck.  2. 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 Electronically Signed   By: Lesia Hausen M.D.   On: 10/16/2021 13:00   CT Head Wo Contrast  Result Date: 10/16/2021 CLINICAL DATA:  Syncope/presyncope, cerebrovascular cause suspected. Dizziness, nausea, and hypertension. EXAM: CT HEAD WITHOUT CONTRAST TECHNIQUE: Contiguous axial images were obtained from the base of the skull through the vertex without intravenous contrast. RADIATION DOSE REDUCTION: This exam was performed according to the departmental dose-optimization program which includes automated exposure control, adjustment of the mA and/or kV according to patient size and/or use of iterative reconstruction technique. COMPARISON:  None. FINDINGS: Brain: There is no evidence of an acute  infarct, intracranial hemorrhage, mass, midline shift, or extra-axial fluid collection. The ventricles and sulci are normal. Vascular: No hyperdense vessel. Skull: No acute fracture or suspicious osseous lesion. Sinuses/Orbits: Partially visualized small mucous retention cyst in the right maxillary sinus. Clear mastoid air cells. Unremarkable orbits. Other: None. IMPRESSION: Unremarkable CT appearance of the brain. Electronically Signed   By: Sebastian Ache M.D.   On: 10/16/2021 10:56    Procedures Procedures    Medications Ordered in ED Medications  sodium chloride 0.9 % bolus 1,000 mL (0 mLs Intravenous Stopped 10/16/21 1200)  ondansetron (ZOFRAN) injection 4 mg (4 mg Intravenous Given 10/16/21 1052)  tetracaine (PONTOCAINE) 0.5 % ophthalmic solution 1 drop (1 drop Both Eyes Given by Other 10/16/21 1052)  iohexol (OMNIPAQUE) 350 MG/ML injection 100 mL (100 mLs Intravenous Contrast Given 10/16/21 1233)    ED Course/ Medical Decision Making/ A&P                           Medical Decision Making Amount and/or Complexity of Data Reviewed Labs: ordered. Radiology: ordered.  Risk Prescription drug management.   Lashaune Freimark is here with nausea, vomiting, fatigue, weakness, dizziness.  Normal vitals.  No fever.  States that around 7 AM she started to feel weak.  She had [employer] for breakfast.  Started to feel worse at work with nausea feeling, dizziness feeling.  Fatigue.  She had episode of diarrhea and 2 episodes of vomiting in route to the emergency department.  She denies any headache or vision changes or weakness or numbness.  More of a generalized weakness.  History of high blood pressure.  Denies any abdominal pain, chest pain, shortness of breath.  Her neurologic exam is overall unremarkable however her left pupil looks slightly more dilated than the right.  Both pupils are reactive.  She does not have any ocular pain or pain behind the eye.  Given asymmetric pupils we will get a head  CT.  We will check basic labs including CBC, CMP, lipase.  Will give IV fluids and IV Zofran.  She denies any traumatic eye history or cataracts or glaucoma or surgery to her eye.  Not sure if this is intracranial related including head bleed or aneurysm.  Could be GI related given nausea, vomiting, diarrhea.  Eye exam is unremarkable, Tono-Pen shows pressure of 22 in both eyes.  Do not think this is acute glaucoma.  Per my review and interpretation of CT scan of head there is no head bleed.  Per review and interpretation of her labs there is no significant anemia, electrolyte abnormality, kidney injury.  Talked with Dr. Jerrell Belfast on the phone with neurology.  Recommends a CTA of head and  neck to further evaluate for aneurysm or other intracranial process.  If that study is unremarkable should be transferred to [facility] for an MRI without contrast to fully rule out cerebellar stroke.  If the studies are unremarkable this could likely be a peripheral vertigo.  May need ophthalmology follow-up for anisocoria.  CTA of the head and neck is overall unremarkable.  There is no aneurysm.  Incidentally there is some ossification of the posterior longitudinal ligament at C5 and C6.  Possibly with some impingement of the exiting left C6 nerve root.  Clinically she does not have any weakness in her upper extremities and think this is incidental.  We will get an MRI of the brain and MRI of the cervical spine to further evaluate her dizziness, asymmetric pupil, CT findings.  Dr. Jeraldine Loots aware of patient being transferred to [facility] for MRI and further stroke work-up.  This chart was dictated using voice recognition software.  Despite best efforts to proofread,  errors can occur which can change the documentation meaning.     Final Clinical Impression(s) / ED Diagnoses Final diagnoses:  Dizziness    Rx / DC Orders ED Discharge Orders     None         Virgina Norfolk, DO 10/16/21 1312    Virgina Norfolk, DO 10/16/21 1316

## 2021-10-16 NOTE — ED Notes (Signed)
Report provided to [facility] for transport to [facility] ED.  Call to Charge Nurse to provide report.  Asher Muir RN made aware of transfer and of pre-med of valium prior to MRI/MRA ?

## 2021-10-16 NOTE — ED Notes (Signed)
Patient transported to CT 

## 2021-10-16 NOTE — ED Notes (Signed)
Pt returned from MRI °

## 2021-10-16 NOTE — ED Triage Notes (Signed)
Pt arrives via ems with complaint of n/v fatigue with sudden onset this morning.  Denies any recent illness or contact with sick family members.  Vomited x2 enroute. ?

## 2021-10-16 NOTE — ED Provider Notes (Signed)
Patient seen by Dr. Lockie Mola from [facility] for MRI.  I went to check on the patient and she denies any nausea, vomiting, dizziness or pain while she sits on the stretcher.  Says she feels somewhat lightheaded when she walks around the department however does not feel she needs any medications at this time.  Stable and awaiting MRI ?  ?Saddie Benders, PA-C ?10/16/21 1822 ? ?  ?Tegeler, Canary Brim, MD ?10/16/21 1931 ? ?

## 2021-10-16 NOTE — ED Notes (Signed)
Patient transported to MRI 

## 2021-10-16 NOTE — Discharge Instructions (Addendum)
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  As we discussed, please rest and stay hydrated and take the meclizine as it may help with the dizziness.  Please follow-up with your primary doctor.  If any symptoms change or worsen acutely, please return to the nearest emergency department. ?

## 2021-10-16 NOTE — ED Provider Notes (Signed)
7:28 PM ?Care assumed from [facility] [place].  At time of transfer of care, patient is awaiting for results of MRI to rule out stroke as a cause of dizziness.  As patient is reportedly ambulating and feeling better, if MRI is reassuring, anticipate discharge home. ? ?9:14 PM ?MRI returned without evidence of stroke.  MRI of the neck did show disc bulging and stenosis but otherwise no concerning findings to cause the dizziness.  Patient reports he is feeling better and was able to ambulate.  We will give prescription for meclizine and she will follow-up with PCP for both the dizziness and the neck stenosis.  She agreed with plan of care and was discharged in good condition. ? ? ?Clinical Impression: ?1. Dizziness   ? ? ?Disposition: Discharge ? ?Condition: Good ? ?I have discussed the results, Dx and Tx plan with the pt(& family if present). He/she/they expressed understanding and agree(s) with the plan. Discharge instructions discussed at great length. Strict return precautions discussed and pt &/or family have verbalized understanding of the instructions. No further questions at time of discharge.  ? ? ?New Prescriptions  ? MECLIZINE (ANTIVERT) 25 MG TABLET    Take 1 tablet (25 mg total) by mouth 3 (three) times daily as needed for dizziness.  ? ? ?Follow Up: ?Wayland Salinas, MD ?[address] ?[address] ?[place] [address] ?[phone] ? ? ? ? ?[place] ?[address] ?[id number] mc ?[place] ?[phone] ? ? ? ? ?  ?Macaulay Reicher, Gwenyth Allegra, MD ?10/16/21 2114 ? ?

## 2021-10-16 NOTE — ED Notes (Signed)
IV attempt x2 unsuccessful right AC.  RN aware, MD at bedside to place. ?
